# Patient Record
Sex: Male | Born: 1995 | Hispanic: Yes | Smoking: Never smoker
Health system: Southern US, Community
[De-identification: ages and names within clinical notes are randomized; demographics above are authoritative.]

## PROBLEM LIST (undated history)

## (undated) DIAGNOSIS — I10 Essential (primary) hypertension: Secondary | ICD-10-CM

## (undated) HISTORY — PX: NO PAST SURGERIES: SHX2092

## (undated) HISTORY — DX: Essential (primary) hypertension: I10

---
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
## 2017-04-02 ENCOUNTER — Other Ambulatory Visit: Payer: Self-pay

## 2017-04-02 ENCOUNTER — Emergency Department
Admission: EM | Admit: 2017-04-02 | Discharge: 2017-04-02 | Disposition: A | Discharge status: Home / Self Care | Payer: Self-pay | Attending: Emergency Medicine | Admitting: Emergency Medicine

## 2017-04-02 ENCOUNTER — Emergency Department: Payer: Self-pay

## 2017-04-02 DIAGNOSIS — Y939 Activity, unspecified: Secondary | ICD-10-CM | POA: Insufficient documentation

## 2017-04-02 DIAGNOSIS — Y999 Unspecified external cause status: Secondary | ICD-10-CM | POA: Insufficient documentation

## 2017-04-02 DIAGNOSIS — W0110XA Fall on same level from slipping, tripping and stumbling with subsequent striking against unspecified object, initial encounter: Secondary | ICD-10-CM | POA: Insufficient documentation

## 2017-04-02 DIAGNOSIS — Y92530 Ambulatory surgery center as the place of occurrence of the external cause: Secondary | ICD-10-CM | POA: Insufficient documentation

## 2017-04-02 DIAGNOSIS — S9031XA Contusion of right foot, initial encounter: Secondary | ICD-10-CM | POA: Insufficient documentation

## 2017-04-02 MED ORDER — NAPROXEN 500 MG PO TBEC: 500.0000 mg | DELAYED_RELEASE_TABLET | Freq: Two times a day (BID) | ORAL | 0 refills | Status: AC

## 2017-04-02 NOTE — ED Triage Notes (Signed)
Pt states that he was in the waiting room her last night with his baby and was told to move to a different area of the waiting room, he tripped and fell over the sign. States that last night it wasn't hurting. Today he reports that it is hurting. Pt able to bear weight. Slight swelling. Ambulates with slow gait.

## 2017-04-02 NOTE — ED Provider Notes (Signed)
[facility] Provider Note  ____________________________________________  Time seen: Approximately 3:32 PM  I have reviewed the triage vital signs and the nursing notes.   HISTORY  Chief Complaint Foot Injury    HPI [facility] is a 22 y.o. male presents to the emergency department with right foot pain in the distribution of the fourth and fifth metatarsals.  Patient reports that he was at [facility] seeking care for his child when he tripped over a "sign".  Patient did not fall or hit his head during incident.  Patient reports that his foot did not hurt last night but began hurting today.  Patient reports that his pain is currently 3 out of 10 when in a supine position and 7 out of 10 with ambulation.  Patient reports that he is "barely walking".  He denies weakness, radiculopathy or changes in sensation of the lower extremities.  No skin compromise occurred.  No alleviating measures have been attempted.   No past medical history on file.  There are no active problems to display for this patient.    Prior to Admission medications   Medication Sig Start Date End Date Taking? Authorizing Provider  naproxen (EC NAPROSYN) 500 MG EC tablet Take 1 tablet (500 mg total) by mouth 2 (two) times daily with a meal for 10 days. 04/02/17 04/12/17  Andrew Feil, PA-C    Allergies Patient has no known allergies.  No family history on file.  Social History Social History   Tobacco Use  . Smoking status: Not on file  Substance Use Topics  . Alcohol use: Not on file  . Drug use: Not on file    Review of Systems  Constitutional: No fever/chills Eyes: No visual changes. No discharge ENT: No upper respiratory complaints. Cardiovascular: no chest pain. Respiratory: no cough. No SOB. Gastrointestinal: No abdominal pain.  No nausea, no vomiting.  No diarrhea.  No constipation. Musculoskeletal: Patient has  right foot pain.  Skin: Negative for rash, abrasions, lacerations, ecchymosis. Neurological: Negative for headaches, focal weakness or numbness.  ______________________________________  PHYSICAL EXAM:  VITAL SIGNS: ED Triage Vitals  Enc Vitals Group     BP 04/02/17 1259 136/70     Pulse Rate 04/02/17 1259 85     Resp 04/02/17 1259 20     Temp 04/02/17 1259 98.5 F (36.9 C)     Temp Source 04/02/17 1259 Oral     SpO2 04/02/17 1259 99 %     Weight 04/02/17 1259 180 lb (81.6 kg)     Height 04/02/17 1259 6\' 1"  (1.854 m)     Head Circumference --      Peak Flow --      Pain Score 04/02/17 1258 7     Pain Loc --      Pain Edu? --      Excl. in GC? --      Constitutional: Alert and oriented. Well appearing and in no acute distress. Eyes: Conjunctivae are normal. PERRL. EOMI. Head: Atraumatic. Cardiovascular: Normal rate, regular rhythm. Normal S1 and S2.  Good peripheral circulation. Respiratory: Normal respiratory effort without tachypnea or retractions. Lungs CTAB. Good air entry to the bases with no decreased or absent breath sounds. Musculoskeletal: Patient is able to perform plantar flexion and dorsi flexion at the right ankle.  No pain was elicited with palpation over the anterior or posterior talofibular ligaments.  No pain to palpation over the deltoid ligament.  No pain  was elicited with palpation of the course of the fibula.  Palpable dorsalis pedis pulse, right.  Neurologic:  Normal speech and language. No gross focal neurologic deficits are appreciated.  Skin:  Skin is warm, dry and intact. No rash noted. Psychiatric: Mood and affect are normal. Speech and behavior are normal. Patient exhibits appropriate insight and judgement.   ____________________________________________   LABS (all labs ordered are listed, but only abnormal results are displayed)  Labs Reviewed - No data to  display ____________________________________________  EKG   ____________________________________________  RADIOLOGY [facility], [facility], personally viewed and evaluated these images (plain radiographs) as part of my medical decision making, as well as reviewing the written report by the radiologist.  Dg Foot Complete Right  Result Date: 04/02/2017 CLINICAL DATA:  Acute right foot pain following fall yesterday. Initial encounter. EXAM: RIGHT FOOT COMPLETE - 3+ VIEW COMPARISON:  05/01/2014 FINDINGS: There is no evidence of fracture or dislocation. There is no evidence of arthropathy or other focal bone abnormality. Soft tissues are unremarkable. IMPRESSION: Negative. Electronically Signed   By: [facility] M.D.   On: 04/02/2017 13:53    ____________________________________________    PROCEDURES  Procedure(s) performed:    Procedures    Medications - No data to display   ____________________________________________   INITIAL IMPRESSION / ASSESSMENT AND PLAN / ED COURSE  Pertinent labs & imaging results that were available during my care of the patient were reviewed by me and considered in my medical decision making (see chart for details).  Review of the [place] CSRS was performed in accordance of the [facility] prior to dispensing any controlled drugs.     Assessment and plan Foot contusion Patient presented to the emergency department with right foot pain after tripping over a sign at [facility] last night.  Differential diagnosis originally included foot contusion versus fracture versus ligamentous sprain.  Physical exam was completely reassuring.  X-ray examination revealed no acute fractures or bony abnormalities.  A postop shoe was provided in the emergency department and patient was discharged with naproxen.  A referral to podiatry was given.  All patient questions were answered.    ____________________________________________  FINAL CLINICAL  IMPRESSION(S) / ED DIAGNOSES  Final diagnoses:  Contusion of right foot, initial encounter      NEW MEDICATIONS STARTED DURING THIS VISIT:  ED Discharge Orders        Ordered    naproxen (EC NAPROSYN) 500 MG EC tablet  2 times daily with meals     04/02/17 1526          This chart was dictated using voice recognition software/Dragon. Despite best efforts to proofread, errors can occur which can change the meaning. Any change was purely unintentional.    [facility] FeilWoods, Andrew M, PA-C 04/02/17 1542    Emily FilbertWilliams, Jonathan E, MD 04/04/17 [phone]

## 2019-01-14 IMAGING — DX DG FOOT COMPLETE 3+V*R*
3 series · 3 of 3 positions shown · non-contrast
Comparison: 05/01/2014

CLINICAL DATA: Acute right foot pain following fall yesterday.
Initial encounter.

EXAM:
RIGHT FOOT COMPLETE - 3+ VIEW

[foot ap]
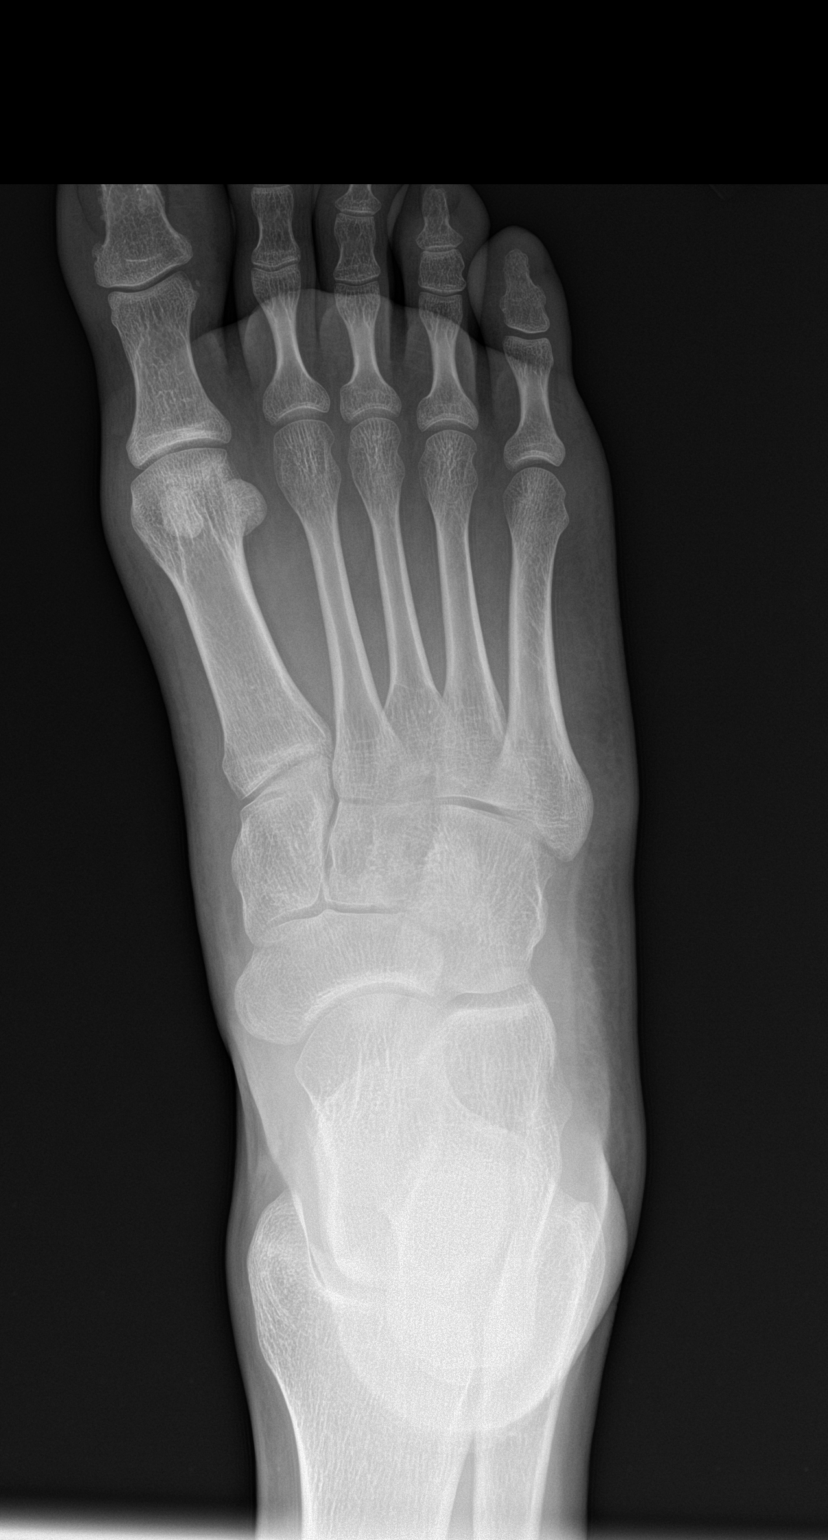

[foot obl]
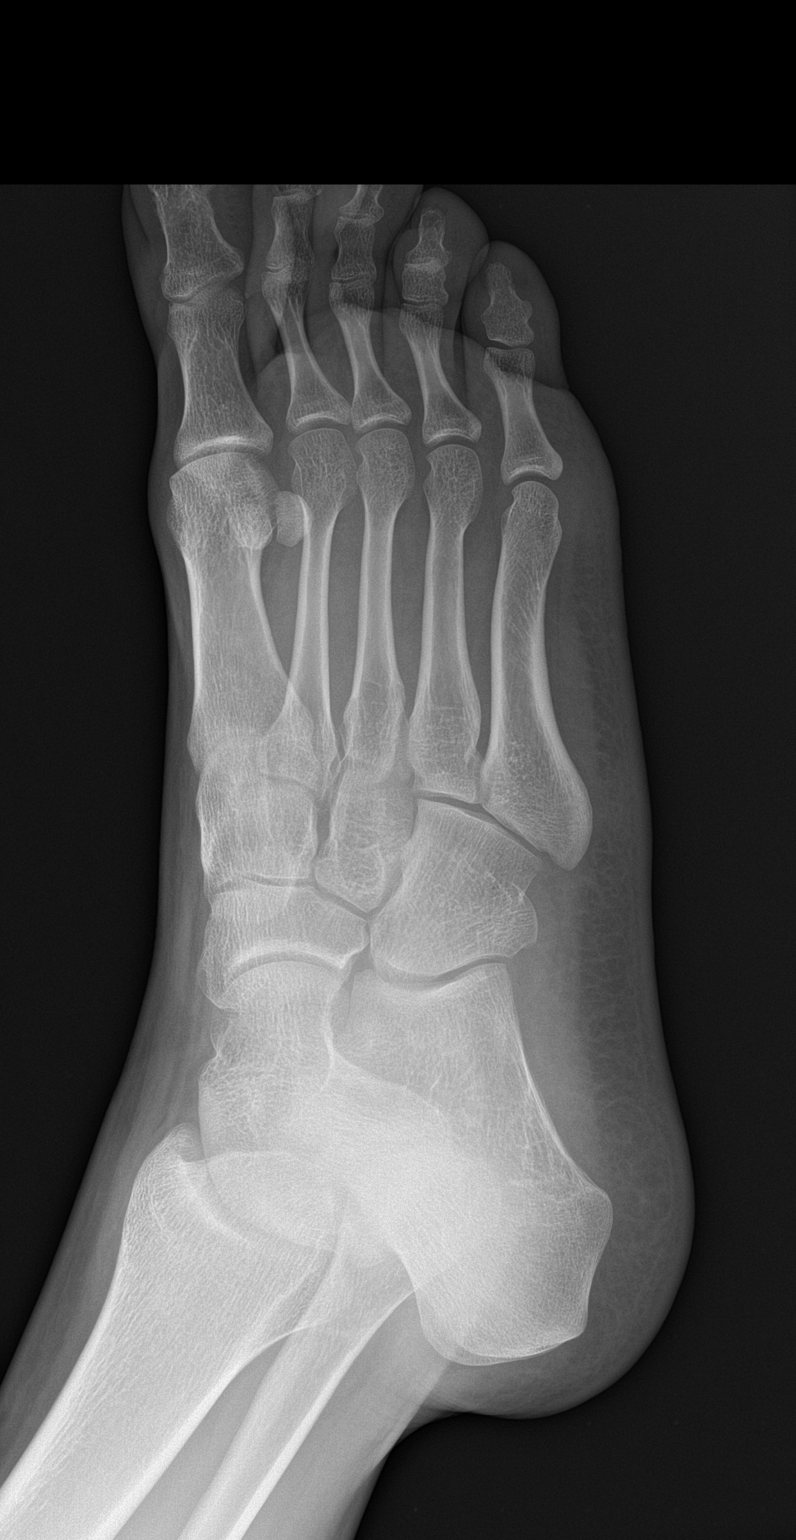

[foot lat]
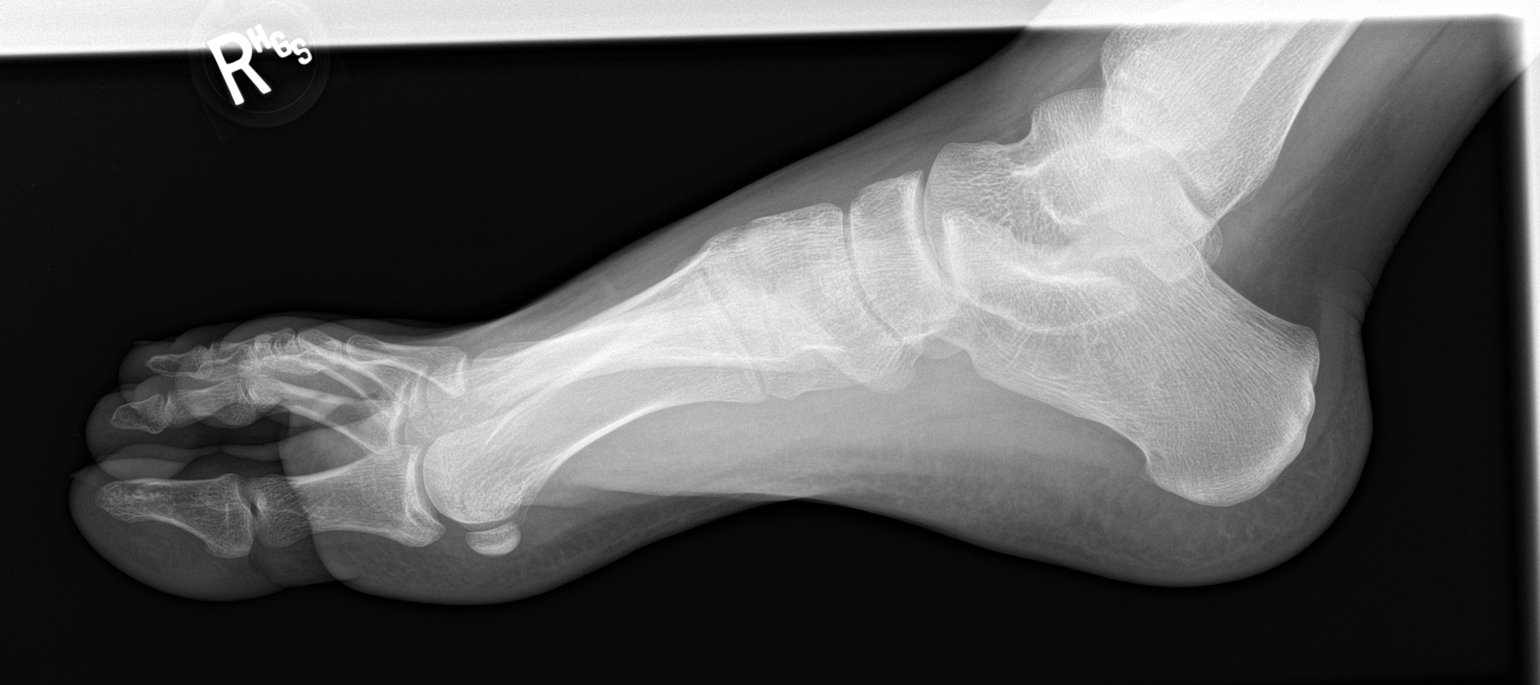

[3 of 3 positions shown; findings below may reference images not displayed]

FINDINGS: There is no evidence of fracture or dislocation. There is no
evidence of arthropathy or other focal bone abnormality. Soft
tissues are unremarkable.
IMPRESSION: Negative.

## 2019-03-04 ENCOUNTER — Ambulatory Visit
Admission: EM | Admit: 2019-03-04 | Discharge: 2019-03-04 | Disposition: A | Discharge status: Home / Self Care | Payer: BC Managed Care – PPO | Attending: Emergency Medicine | Admitting: Emergency Medicine

## 2019-03-04 ENCOUNTER — Other Ambulatory Visit: Payer: Self-pay

## 2019-03-04 ENCOUNTER — Encounter: Payer: Self-pay | Admitting: Emergency Medicine

## 2019-03-04 DIAGNOSIS — L600 Ingrowing nail: Secondary | ICD-10-CM | POA: Diagnosis not present

## 2019-03-04 MED ORDER — MUPIROCIN 2 % EX OINT: 1.0000 "application " | TOPICAL_OINTMENT | Freq: Three times a day (TID) | CUTANEOUS | 0 refills | Status: DC

## 2019-03-04 MED ORDER — DOXYCYCLINE HYCLATE 100 MG PO CAPS: 100.0000 mg | ORAL_CAPSULE | Freq: Two times a day (BID) | ORAL | 0 refills | Status: DC

## 2019-03-04 NOTE — ED Provider Notes (Addendum)
[facility]    CSN: [id number] Arrival date & time: 03/04/19  1342      History   Chief Complaint Chief Complaint  Patient presents with  . Ingrown Toenail    HPI [facility] is a 23 y.o. male.   HPI   23 year old male presents with ingrown toenails on both of his feet that present for 2 to 3 weeks.  Affects his great and second toe on his right foot and second and third toes on the left foot.  He  has evidently improper trimming of his nails.  He works at a [employer] that he has had for about a month but he states that they do not seem to be tight fitting and have an adequate toe box.  Does not have any fluctuance or drainage at the present time.  He has had no fever or chills.  History reviewed. No pertinent past medical history.  There are no active problems to display for this patient.   Past Surgical History:  Procedure Laterality Date  . NO PAST SURGERIES         Home Medications    Prior to Admission medications   Medication Sig Start Date End Date Taking? Authorizing Provider  doxycycline (VIBRAMYCIN) 100 MG capsule Take 1 capsule (100 mg total) by mouth 2 (two) times daily. 03/04/19   Lutricia Feil, PA-C  mupirocin ointment (BACTROBAN) 2 % Apply 1 application topically 3 (three) times daily. 03/04/19   Lutricia Feil, PA-C    Family History Family History  Problem Relation Age of Onset  . Healthy Mother   . Healthy Father     Social History Social History   Tobacco Use  . Smoking status: Never Smoker  . Smokeless tobacco: Never Used  Substance Use Topics  . Alcohol use: Yes    Comment: social  . Drug use: Never     Allergies   Patient has no known allergies.   Review of Systems Review of Systems  Constitutional: Positive for activity change. Negative for appetite change, chills, diaphoresis, fatigue and fever.  Skin: Positive for color change and wound.  All other systems reviewed  and are negative.    Physical Exam Triage Vital Signs ED Triage Vitals  Enc Vitals Group     BP 03/04/19 1401 (!) 141/88     Pulse Rate 03/04/19 1401 78     Resp 03/04/19 1401 18     Temp 03/04/19 1401 98.1 F (36.7 C)     Temp Source 03/04/19 1401 Oral     SpO2 03/04/19 1401 100 %     Weight 03/04/19 1401 220 lb (99.8 kg)     Height 03/04/19 1401 6\' 1"  (1.854 m)     Head Circumference --      Peak Flow --      Pain Score 03/04/19 1400 6     Pain Loc --      Pain Edu? --      Excl. in GC? --    No data found.  Updated Vital Signs BP (!) 141/88 (BP Location: Left Arm)   Pulse 78   Temp 98.1 F (36.7 C) (Oral)   Resp 18   Ht 6\' 1"  (1.854 m)   Wt 220 lb (99.8 kg)   SpO2 100%   BMI 29.03 kg/m   Visual Acuity Right Eye Distance:   Left Eye Distance:   Bilateral Distance:    Right Eye Near:  Left Eye Near:    Bilateral Near:     Physical Exam Vitals signs and nursing note reviewed.  Constitutional:      General: He is not in acute distress.    Appearance: Normal appearance. He is normal weight. He is not ill-appearing or toxic-appearing.  Eyes:     Conjunctiva/sclera: Conjunctivae normal.  Neck:     Musculoskeletal: Normal range of motion and neck supple.  Cardiovascular:     Rate and Rhythm: Normal rate and regular rhythm.     Heart sounds: Normal heart sounds.  Pulmonary:     Effort: Pulmonary effort is normal.     Breath sounds: Normal breath sounds.  Musculoskeletal: Normal range of motion.        General: Tenderness, deformity and signs of injury present.     Comments: Refer to photographs for detail  Skin:    General: Skin is warm and dry.  Neurological:     General: No focal deficit present.     Mental Status: He is alert and oriented to person, place, and time.  Psychiatric:        Mood and Affect: Mood normal.        Behavior: Behavior normal.        Thought Content: Thought content normal.        Judgment: Judgment normal.             UC Treatments / Results  Labs (all labs ordered are listed, but only abnormal results are displayed) Labs Reviewed - No data to display  EKG   Radiology No results found.  Procedures Procedures (including critical care time)  Medications Ordered in UC Medications - No data to display  Initial Impression / Assessment and Plan / UC Course  I have reviewed the triage vital signs and the nursing notes.  Pertinent labs & imaging results that were available during my care of the patient were reviewed by me and considered in my medical decision making (see chart for details).   I have reviewed with the patient the need for trimming of his nails.  Because of the extensiveness plus the deformity of the toes I will refer him to podiatry for further evaluation and care.  In the meantime I will place him on a warm compresses and Bactroban ointment along with short course of doxycycline.  He will arrange an appointment with Dr. Vickki Muff for next week.   Final Clinical Impressions(s) / UC Diagnoses   Final diagnoses:  Ingrown toenail of both feet     Discharge Instructions     Wet a washcloth under the faucet and then place in the microwave oven for 10 to 15 seconds.  Place the cloth over the toes leaving it there for 5-10 minutes.  Dry the area thoroughly and apply mupirocin ointment to the nail beds.  Perform this 3-4 times each and every day until the condition has resolved.  Be certain to take all of the doxycycline.  Schedule an appointment with Dr. Vickki Muff   ED Prescriptions    Medication Sig Dispense Auth. Provider   mupirocin ointment (BACTROBAN) 2 % Apply 1 application topically 3 (three) times daily. 22 g Crecencio Mc P, PA-C   doxycycline (VIBRAMYCIN) 100 MG capsule Take 1 capsule (100 mg total) by mouth 2 (two) times daily. 10 capsule Lorin Picket, PA-C     PDMP not reviewed this encounter.   Lorin Picket, PA-C 03/04/19 1457    Lorin Picket, PA-C  03/04/19 1458  

## 2019-03-04 NOTE — ED Triage Notes (Addendum)
Patient in today c/o ingrown toenails both feet x 2-3 weeks ago. Patient's great and 2nd toe on his right foot and great, 2nd and 3rd toes on the left foot.

## 2019-03-04 NOTE — Discharge Instructions (Signed)
Wet a washcloth under the faucet and then place in the microwave oven for 10 to 15 seconds.  Place the cloth over the toes leaving it there for 5-10 minutes.  Dry the area thoroughly and apply mupirocin ointment to the nail beds.  Perform this 3-4 times each and every day until the condition has resolved.  Be certain to take all of the doxycycline.  Schedule an appointment with Dr. Vickki Muff

## 2023-01-19 ENCOUNTER — Ambulatory Visit: Payer: BC Managed Care – PPO | Admitting: Physician Assistant

## 2023-01-19 ENCOUNTER — Encounter: Payer: Self-pay | Admitting: Physician Assistant

## 2023-01-19 VITALS — BP 148/92 | HR 85 | Temp 98.6°F | Ht 72.0 in | Wt 242.2 lb

## 2023-01-19 DIAGNOSIS — Z131 Encounter for screening for diabetes mellitus: Secondary | ICD-10-CM

## 2023-01-19 DIAGNOSIS — I10 Essential (primary) hypertension: Secondary | ICD-10-CM | POA: Diagnosis not present

## 2023-01-19 DIAGNOSIS — Z0000 Encounter for general adult medical examination without abnormal findings: Secondary | ICD-10-CM

## 2023-01-19 DIAGNOSIS — Z23 Encounter for immunization: Secondary | ICD-10-CM

## 2023-01-19 DIAGNOSIS — Z13220 Encounter for screening for lipoid disorders: Secondary | ICD-10-CM | POA: Diagnosis not present

## 2023-01-19 DIAGNOSIS — R079 Chest pain, unspecified: Secondary | ICD-10-CM

## 2023-01-19 LAB — COMPREHENSIVE METABOLIC PANEL
ALT: 21 U/L (ref 0–53)
AST: 15 U/L (ref 0–37)
Albumin: 4.9 g/dL (ref 3.5–5.2)
Alkaline Phosphatase: 77 U/L (ref 39–117)
BUN: 18 mg/dL (ref 6–23)
CO2: 28 meq/L (ref 19–32)
Calcium: 9.9 mg/dL (ref 8.4–10.5)
Chloride: 102 meq/L (ref 96–112)
Creatinine, Ser: 0.78 mg/dL (ref 0.40–1.50)
GFR: 124.27 mL/min (ref >60.00)
Glucose, Bld: 99 mg/dL (ref 70–99)
Potassium: 4.5 meq/L (ref 3.5–5.1)
Sodium: 136 meq/L (ref 135–145)
Total Bilirubin: 0.5 mg/dL (ref 0.2–1.2)
Total Protein: 8 g/dL (ref 6.0–8.3)

## 2023-01-19 LAB — HEMOGLOBIN A1C: Hgb A1c MFr Bld: 5.6 % (ref 4.6–6.5)

## 2023-01-19 LAB — CBC WITH DIFFERENTIAL/PLATELET
Basophils Absolute: 0 10*3/uL (ref 0.0–0.1)
Basophils Relative: 0.5 % (ref 0.0–3.0)
Eosinophils Absolute: 0 10*3/uL (ref 0.0–0.7)
Eosinophils Relative: 0.3 % (ref 0.0–5.0)
HCT: 45.3 % (ref 39.0–52.0)
Hemoglobin: 14.8 g/dL (ref 13.0–17.0)
Lymphocytes Relative: 35.3 % (ref 12.0–46.0)
Lymphs Abs: 2.8 10*3/uL (ref 0.7–4.0)
MCHC: 32.7 g/dL (ref 30.0–36.0)
MCV: 83.2 fL (ref 78.0–100.0)
Monocytes Absolute: 0.4 10*3/uL (ref 0.1–1.0)
Monocytes Relative: 5.3 % (ref 3.0–12.0)
Neutro Abs: 4.7 10*3/uL (ref 1.4–7.7)
Neutrophils Relative %: 58.6 % (ref 43.0–77.0)
Platelets: 279 10*3/uL (ref 150.0–400.0)
RBC: 5.44 Mil/uL (ref 4.22–5.81)
RDW: 14 % (ref 11.5–15.5)
WBC: 8.1 10*3/uL (ref 4.0–10.5)

## 2023-01-19 LAB — LIPID PANEL
Cholesterol: 168 mg/dL (ref 0–200)
HDL: 37.8 mg/dL — ABNORMAL LOW (ref >39.00)
LDL Cholesterol: 76 mg/dL (ref 0–99)
NonHDL: 129.7
Total CHOL/HDL Ratio: 4
Triglycerides: 269 mg/dL — ABNORMAL HIGH (ref 0.0–149.0)
VLDL: 53.8 mg/dL — ABNORMAL HIGH (ref 0.0–40.0)

## 2023-01-19 LAB — TSH: TSH: 2.04 u[IU]/mL (ref 0.35–5.50)

## 2023-01-19 MED ORDER — AMLODIPINE BESYLATE 5 MG PO TABS: 5.0000 mg | ORAL_TABLET | Freq: Every day | ORAL | 1 refills | Status: AC

## 2023-01-19 NOTE — Progress Notes (Signed)
Subjective:    Patient ID: [facility], male    DOB: [birth year], 27 y.o.   MRN: [id number]  Chief Complaint  Patient presents with   New Patient (Initial Visit)    New Pt in office to establish care; pt last saw a PCP last year; pt states he has no concerns to discuss; periodically get a sharp pain in chest; no tightness or SOB during events; pt has hx of hypertension and previously taking Lisinopril but not taking since January. Pt needing CPE form for employer completed today and labs but not fasting    HPI  Discussed the use of AI scribe software for clinical note transcription with the patient, who gave verbal consent to proceed.  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 He lives with his girlfriend and their five-year-old daughter. He reports no significant past medical history and no surgeries. His mother has a history of leukemia, now in remission, and his paternal grandfather has hypertension. He stopped taking Lisinopril for blood pressure control in January and is unsure if it was effective. He occasionally checks his blood pressure outside of the doctor's office and notes it to be high. He tries to stay active by walking after work and mostly eats at home.  Over the past few months, he has been experiencing intermittent sharp pains in the left side of his chest. These episodes occur while he is at rest and resolve spontaneously. They have not been severe enough to warrant a hospital visit. He denies any association with exertion or activity. He used to consume a lot of caffeine but has cut back recently. He also reports some stress related to work.       Past Medical History:  Diagnosis Date   Hypertension     Past Surgical History:  Procedure Laterality Date   NO PAST SURGERIES      Family History  Problem Relation Age of Onset   Leukemia Mother    Healthy Father    Hypertension Paternal  Grandfather     Social History   Tobacco Use   Smoking status: Never   Smokeless tobacco: Never  Vaping Use   Vaping status: Never Used  Substance Use Topics   Alcohol use: Yes    Alcohol/week: 12.0 standard drinks of alcohol    Types: 12 Cans of beer per week    Comment: social   Drug use: Never     No Known Allergies  Review of Systems NEGATIVE UNLESS OTHERWISE INDICATED IN HPI      Objective:     BP (!) 148/92 (BP Location: Right Arm, Patient Position: Sitting) Comment (Patient Position): manually  Pulse 85   Temp 98.6 F (37 C) (Temporal)   Ht 6' (1.829 m)   Wt 242 lb 3.2 oz (109.9 kg)   SpO2 99%   BMI 32.85 kg/m   Wt Readings from Last 3 Encounters:  01/19/23 242 lb 3.2 oz (109.9 kg)  03/04/19 220 lb (99.8 kg)  04/02/17 180 lb (81.6 kg) (82%, Z= 0.92)*   * Growth percentiles are based on CDC (Boys, 2-20 Years) data.    BP Readings from Last 3 Encounters:  01/19/23 (!) 148/92  03/04/19 (!) 141/88  04/02/17 136/70     Physical Exam Vitals and nursing note reviewed.  Constitutional:      General: He is not in acute distress.    Appearance: Normal appearance. He is not toxic-appearing.  HENT:  Head: Normocephalic and atraumatic.     Right Ear: Tympanic membrane, ear canal and external ear normal.     Left Ear: Tympanic membrane, ear canal and external ear normal.     Nose: Nose normal.     Mouth/Throat:     Mouth: Mucous membranes are moist.     Pharynx: Oropharynx is clear.  Eyes:     Extraocular Movements: Extraocular movements intact.     Conjunctiva/sclera: Conjunctivae normal.     Pupils: Pupils are equal, round, and reactive to light.  Cardiovascular:     Rate and Rhythm: Normal rate and regular rhythm.     Pulses: Normal pulses.     Heart sounds: Normal heart sounds.  Pulmonary:     Effort: Pulmonary effort is normal.     Breath sounds: Normal breath sounds.  Abdominal:     General: Abdomen is flat. Bowel sounds are normal.      Palpations: Abdomen is soft.     Tenderness: There is no abdominal tenderness.  Musculoskeletal:        General: Normal range of motion.     Cervical back: Normal range of motion and neck supple.  Skin:    General: Skin is warm and dry.  Neurological:     General: No focal deficit present.     Mental Status: He is alert and oriented to person, place, and time.  Psychiatric:        Mood and Affect: Mood normal.        Behavior: Behavior normal.        Assessment & Plan:  Encounter for annual physical exam -     CBC with Differential/Platelet -     Comprehensive metabolic panel -     Lipid panel -     TSH -     Hemoglobin A1c  Immunization due -     Tdap vaccine greater than or equal to 7yo IM  Left-sided chest pain -     EKG 12-Lead -     amLODIPine Besylate; Take 1 tablet (5 mg total) by mouth daily.  Dispense: 30 tablet; Refill: 1  Essential hypertension -     amLODIPine Besylate; Take 1 tablet (5 mg total) by mouth daily.  Dispense: 30 tablet; Refill: 1    Age-appropriate screening and counseling performed today. Will check labs and call with results. Preventive measures discussed and printed in AVS for patient.   Patient Counseling: [x]   Nutrition: Stressed importance of moderation in sodium/caffeine intake, saturated fat and cholesterol, caloric balance, sufficient intake of fresh fruits, vegetables, and fiber.  [x]   Stressed the importance of regular exercise.   [x]   Substance Abuse: Discussed cessation/primary prevention of tobacco, alcohol, or other drug use; driving or other dangerous activities under the influence; availability of treatment for abuse.   []   Injury prevention: Discussed safety belts, safety helmets, smoke detector, smoking near bedding or upholstery.   []   Sexuality: Discussed sexually transmitted diseases, partner selection, use of condoms, avoidance of unintended pregnancy  and contraceptive alternatives.   [x]   Dental health: Discussed  importance of regular tooth brushing, flossing, and dental visits.  [x]   Health maintenance and immunizations reviewed. Please refer to Health maintenance section.     No chest pain present today. Last felt sharp pain a few weeks ago. Hx HTN. Checked an EKG today, reveals NSR with tall QRS and T waves. Discussed with pt we need to get BP under better control.  Let's start on amlodipine  5 mg daily to help lower blood pressure. Log BP one to two times daily. Call if any concerns. Goal BP is 130/80 or lower.  Do not smoke or drink alcohol. Limit salt in food. Exercise regularly to keep BP in normal range.   ER if any sudden chest pain or pressure.     Return in about 4 weeks (around 02/16/2023) for recheck/follow-up.    Spenser Cong M Evelyna Folker, PA-C

## 2023-01-19 NOTE — Patient Instructions (Addendum)
Welcome to [facility]! It was a pleasure meeting you today.  As discussed, Please schedule a 1 month follow up visit today.  Blood work today Tetanus updated  EKG looks normal, other than signs of high blood pressure. Let's start on amlodipine 5 mg daily to help lower blood pressure. Log BP one to two times daily. Call if any concerns. Goal BP is 130/80 or lower.  Do not smoke or drink alcohol. Limit salt in food. Exercise regularly to keep BP in normal range.   ER if any sudden chest pain or pressure.   PLEASE NOTE:  If you had any LAB tests please let us know if you have not heard back within a few days. You may see your results on MyChart before we have a chance to review them but we will give you a call once they are reviewed by [place]. If we ordered any REFERRALS today, please let us know if you have not heard from their office within the next two weeks. Let us know through MyChart if you are needing REFILLS, or have your pharmacy send [place] the request. You can also use MyChart to communicate with me or any office staff.  Please try these tips to maintain a healthy lifestyle:  Eat most of your calories during the day when you are active. Eliminate processed foods including packaged sweets (pies, cakes, cookies), reduce intake of potatoes, white bread, white pasta, and white rice. Look for whole grain options, oat flour or almond flour.  Each meal should contain half fruits/vegetables, one quarter protein, and one quarter carbs (no bigger than a computer mouse).  Cut down on sweet beverages. This includes juice, soda, and sweet tea. Also watch fruit intake, though this is a healthier sweet option, it still contains natural sugar! Limit to 3 servings daily.  Drink at least 1 glass of water with each meal and aim for at least 8 glasses (64 ounces) per day.  Exercise at least 150 minutes every week to the best of your ability.    Take Care,  Duquan Gillooly, PA-C

## 2023-02-16 ENCOUNTER — Ambulatory Visit: Payer: BC Managed Care – PPO | Admitting: Physician Assistant
